# Patient Record
Sex: Male | Born: 1985 | Race: Black or African American | Hispanic: No | Marital: Single | Smoking: Current every day smoker
Health system: Southern US, Community
[De-identification: ages and names within clinical notes are randomized; demographics above are authoritative.]

---
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
## 2008-06-08 ENCOUNTER — Emergency Department (HOSPITAL_COMMUNITY): Admission: EM | Admit: 2008-06-08 | Discharge: 2008-06-08 | Payer: Self-pay | Admitting: Emergency Medicine

## 2008-12-21 ENCOUNTER — Emergency Department (HOSPITAL_COMMUNITY): Admission: EM | Admit: 2008-12-21 | Discharge: 2008-12-22 | Payer: Self-pay | Admitting: Emergency Medicine

## 2009-04-12 ENCOUNTER — Emergency Department (HOSPITAL_COMMUNITY): Admission: EM | Admit: 2009-04-12 | Discharge: 2009-04-12 | Payer: Self-pay | Admitting: Emergency Medicine

## 2009-04-12 ENCOUNTER — Emergency Department (HOSPITAL_COMMUNITY): Admission: EM | Admit: 2009-04-12 | Discharge: 2009-04-12 | Payer: Self-pay | Admitting: Family Medicine

## 2010-06-04 IMAGING — CT CT MAXILLOFACIAL W/O CM
3 of 4 series · 16 of 47 positions shown, 19 images · non-contrast
Comparison: Mandible radiographs done the same date.

CLINICAL DATA: Fall today.  Left mandible fracture.

CT MAXILLOFACIAL WITHOUT CONTRAST
TECHNIQUE: Multidetector CT imaging of the maxillofacial
structures was performed. Multiplanar CT image reconstructions were
also generated.

[Series 4: orbit/facial 2.0 h30s st · axial · 0.37mm/px · z∈[+896,+1022]mm · 10 of 75 slices shown, 13 images]
[im 6/75  brain]
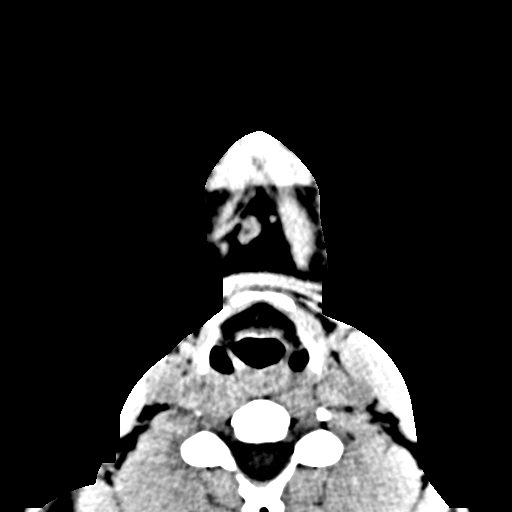
[im 6/75  bone]
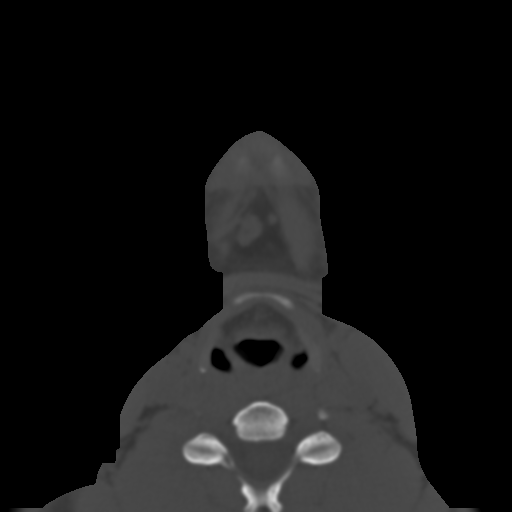
[im 14/75  bone]
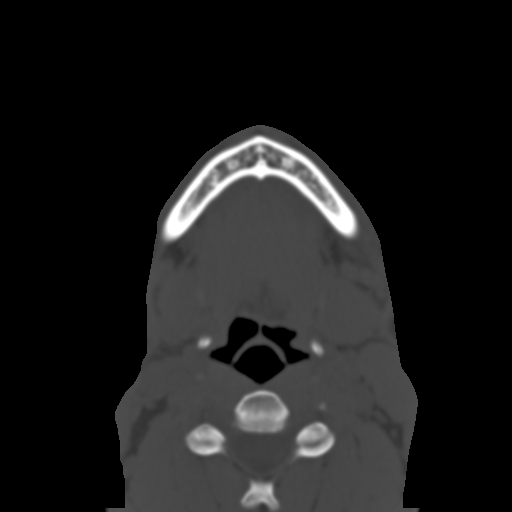
[im 19/75  bone]
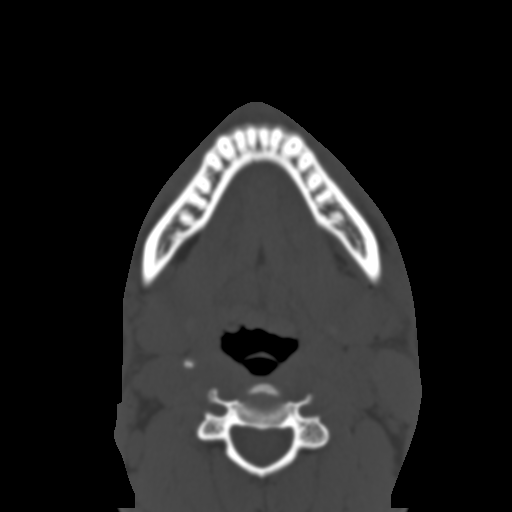
[im 27/75  bone]
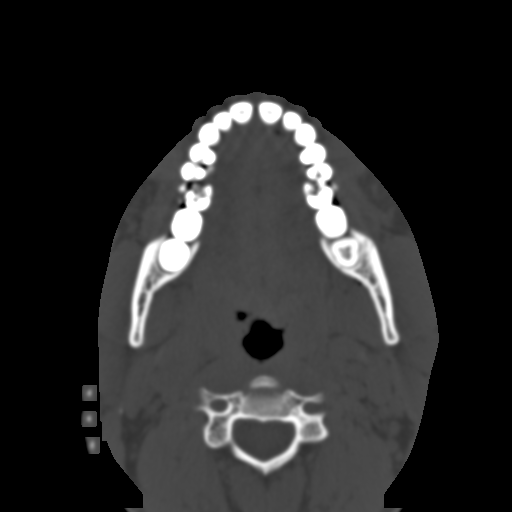
[im 35/75  brain]
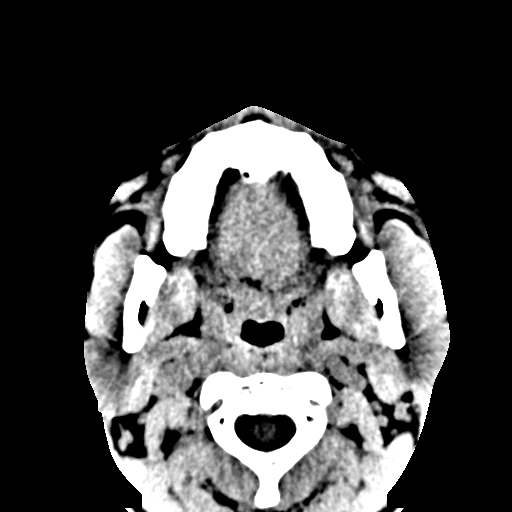
[im 35/75  bone]
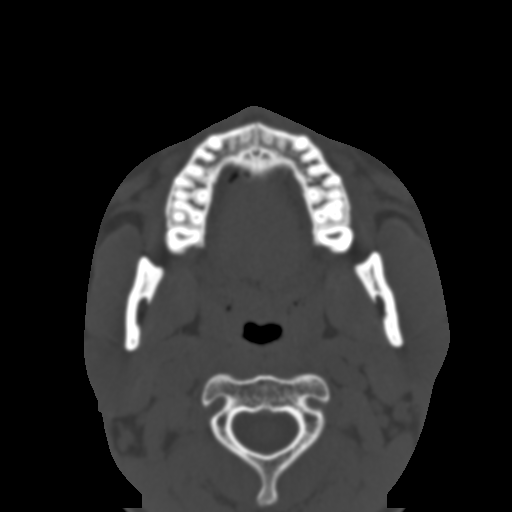
[im 40/75  bone]
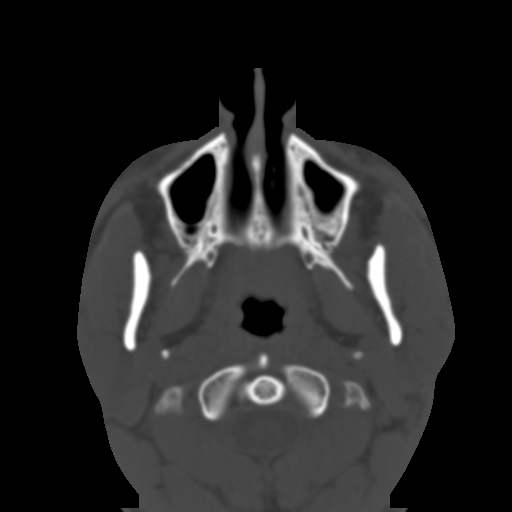
[im 48/75  bone]
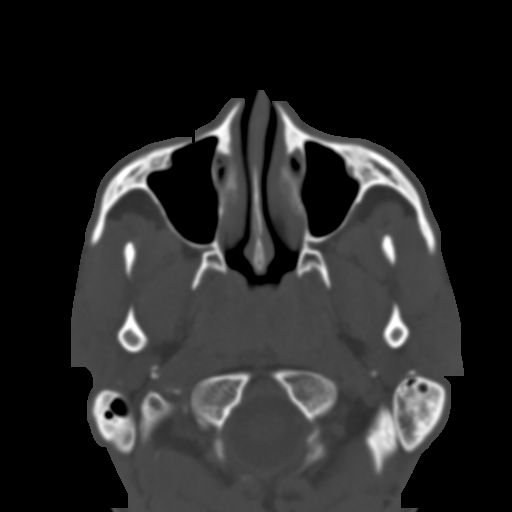
[im 56/75  bone]
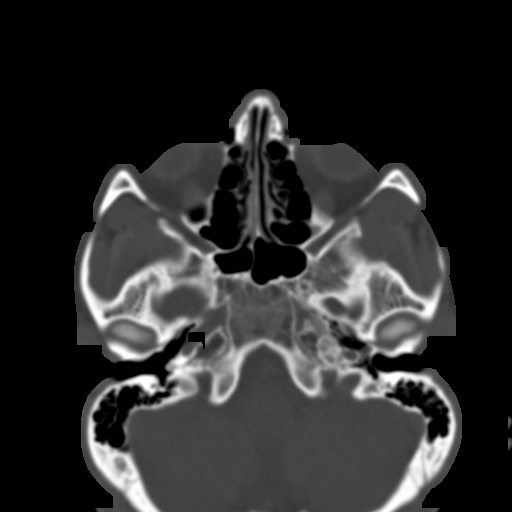
[im 61/75  brain]
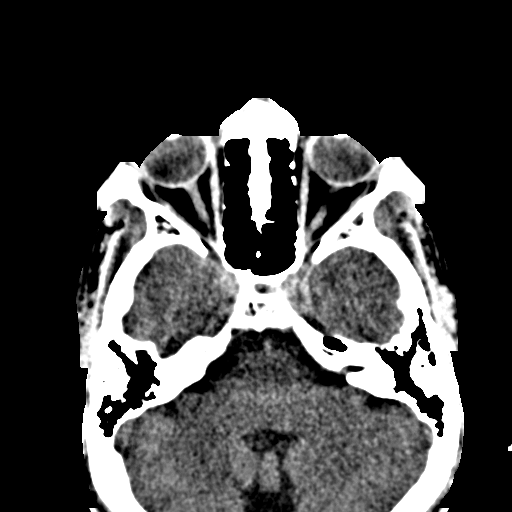
[im 61/75  bone]
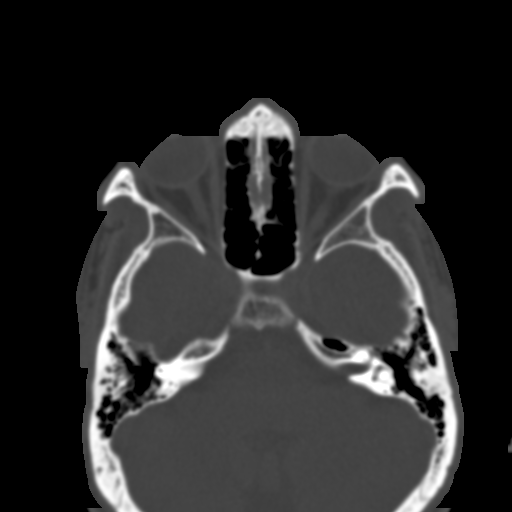
[im 69/75  bone]
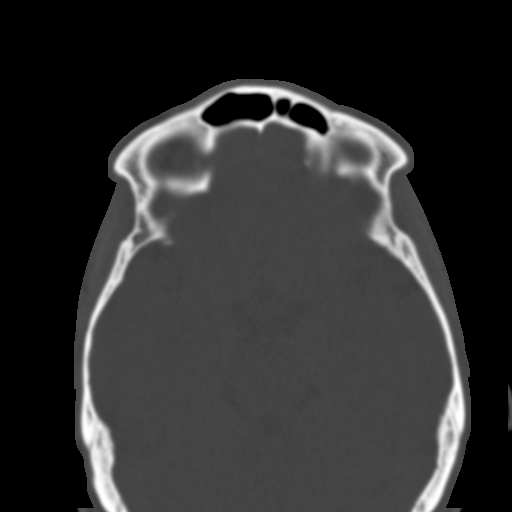

[Series 604: st cor · coronal · 0.37mm/px · 3 of 51 slices shown]
[im 17/51  bone]
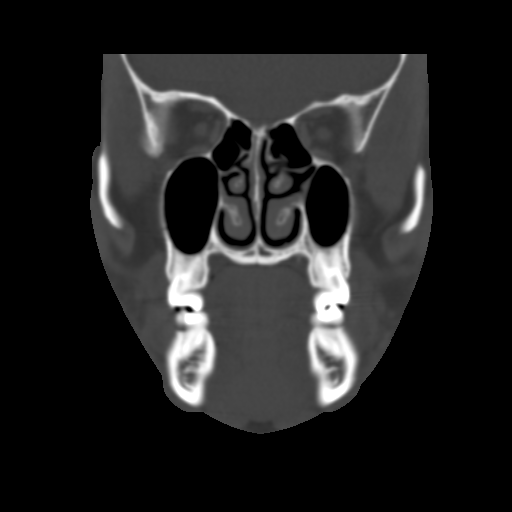
[im 23/51  bone]
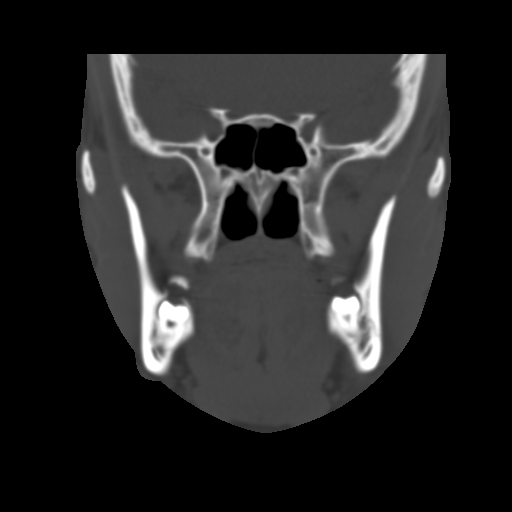
[im 28/51  bone]
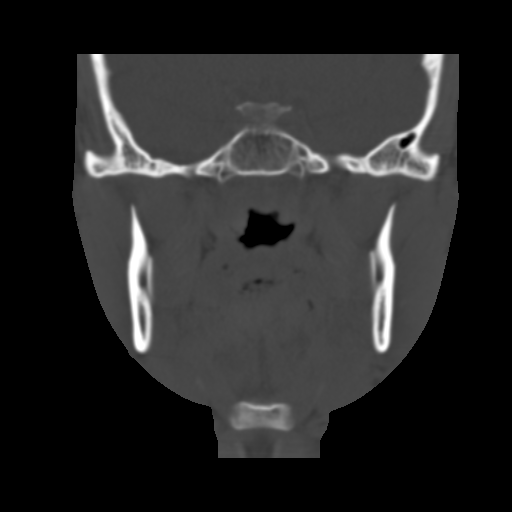

[Series 605: st sag · sagittal · 0.37mm/px · 3 of 53 slices shown]
[im 18/53  bone]
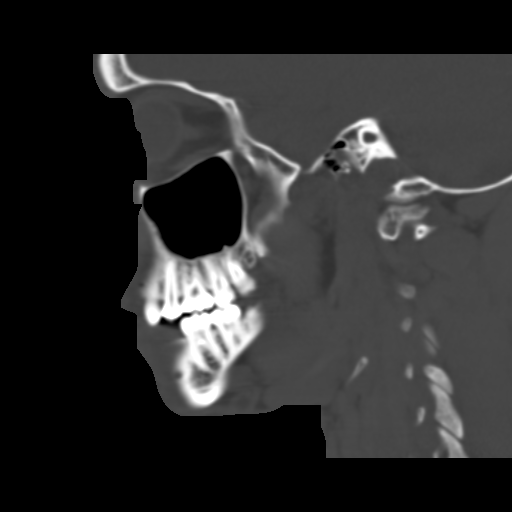
[im 27/53  bone]
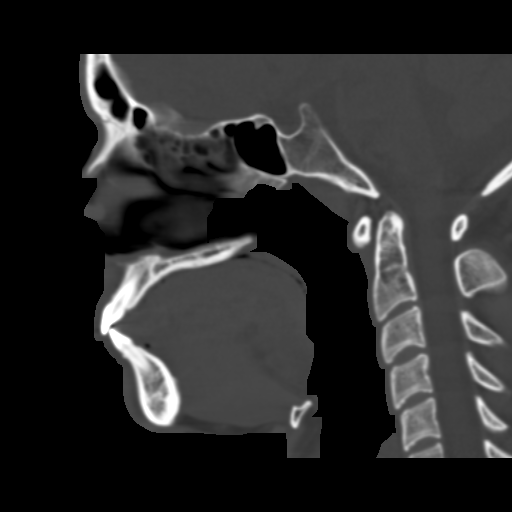
[im 35/53  bone]
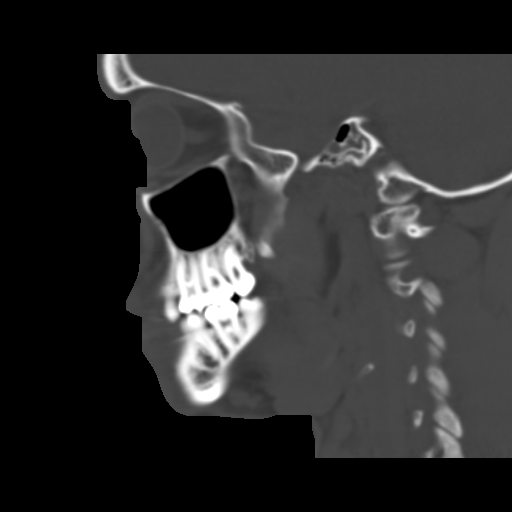

[16 of 47 positions shown; findings below may reference images not displayed]

FINDINGS: CT confirms the presence of a nondisplaced fracture of
the left mandibular body posteriorly.  This extends to the roots of
the left mandibular wisdom tooth.  No definite contralateral or
parasymphyseal fracture is identified.  The condylar necks are
intact.  The temporal mandibular joints are intact.  There is no
air in the TMJs.

No other facial fractures are identified.  The paranasal sinuses
are clear without air fluid levels.  There is no orbital hematoma.
Visualized intracranial contents appear unremarkable.
IMPRESSION: 1.  Nondisplaced fracture of the left mandibular body.
2.  No contralateral fracture, TMJ injury or other facial fracture
identified.

## 2010-09-13 ENCOUNTER — Encounter: Payer: Self-pay | Admitting: Family Medicine
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
## 2018-09-27 ENCOUNTER — Encounter (HOSPITAL_COMMUNITY): Payer: Self-pay | Admitting: Emergency Medicine

## 2018-09-27 ENCOUNTER — Emergency Department (HOSPITAL_COMMUNITY)
Admission: EM | Admit: 2018-09-27 | Discharge: 2018-09-27 | Disposition: A | Discharge status: Home / Self Care | Payer: Self-pay | Attending: Emergency Medicine | Admitting: Emergency Medicine

## 2018-09-27 ENCOUNTER — Other Ambulatory Visit: Payer: Self-pay

## 2018-09-27 DIAGNOSIS — R197 Diarrhea, unspecified: Secondary | ICD-10-CM | POA: Insufficient documentation

## 2018-09-27 DIAGNOSIS — F17210 Nicotine dependence, cigarettes, uncomplicated: Secondary | ICD-10-CM | POA: Insufficient documentation

## 2018-09-27 MED ORDER — LOPERAMIDE HCL 2 MG PO CAPS: 2.0000 mg | ORAL_CAPSULE | Freq: Four times a day (QID) | ORAL | 0 refills | Status: AC | PRN

## 2018-09-27 NOTE — ED Triage Notes (Signed)
Onset last night diarrhea, nausea, mild abdominal pain.

## 2018-09-27 NOTE — ED Provider Notes (Signed)
[facility] EMERGENCY DEPARTMENT Provider Note   CSN: [id number] Arrival date & time: 09/27/18  1742     History   Chief Complaint Chief Complaint  Patient presents with  . Diarrhea    HPI Gregg Roberson is a 33 y.o. male.  He is presenting with 4 episodes of loose stool since last evening.  He said he had a little bit upper cramping pain this morning that has resolved.  He had some nausea that was resolved.  No fevers no chills.  No urinary symptoms.  There was no blood in his stool.  No sick contacts or recent travel.  He is tried nothing for it.  The history is provided by the patient.  Diarrhea  Quality:  Watery Severity:  Moderate Onset quality:  Sudden Number of episodes:  4 Duration:  1 day Timing:  Intermittent Progression:  Partially resolved Relieved by:  Nothing Worsened by:  Nothing Ineffective treatments:  None tried Associated symptoms: abdominal pain   Associated symptoms: no chills, no recent cough, no fever, no headaches and no myalgias   Risk factors: no recent antibiotic use, no sick contacts, no suspicious food intake and no travel to endemic areas     History reviewed. No pertinent past medical history.  There are no active problems to display for this patient.   History reviewed. No pertinent surgical history.      Home Medications    Prior to Admission medications   Not on File    Family History No family history on file.  Social History Social History   Tobacco Use  . Smoking status: Current Every Day Smoker    Packs/day: 0.50    Types: Cigarettes  . Smokeless tobacco: Never Used  Substance Use Topics  . Alcohol use: Yes    Comment: socially  . Drug use: Never     Allergies   Patient has no allergy information on record.   Review of Systems Review of Systems  Constitutional: Negative for chills and fever.  HENT: Negative for sore throat.   Eyes: Negative for visual disturbance.  Respiratory: Negative for shortness of  breath.   Cardiovascular: Negative for chest pain.  Gastrointestinal: Positive for abdominal pain and diarrhea.  Genitourinary: Negative for dysuria.  Musculoskeletal: Negative for myalgias.  Skin: Negative for rash.  Neurological: Negative for headaches.     Physical Exam Updated Vital Signs BP (!) 141/90 (BP Location: Right Arm)   Pulse 80   Temp 98.2 F (36.8 C) (Oral)   Resp 20   SpO2 99%   Physical Exam Vitals signs and nursing note reviewed.  Constitutional:      Appearance: He is well-developed.  HENT:     Head: Normocephalic and atraumatic.  Eyes:     Conjunctiva/sclera: Conjunctivae normal.  Neck:     Musculoskeletal: Neck supple.  Cardiovascular:     Rate and Rhythm: Normal rate and regular rhythm.     Heart sounds: No murmur.  Pulmonary:     Effort: Pulmonary effort is normal. No respiratory distress.     Breath sounds: Normal breath sounds.  Abdominal:     Palpations: Abdomen is soft.     Tenderness: There is no abdominal tenderness. There is no guarding or rebound.  Musculoskeletal: Normal range of motion.        General: No tenderness.     Right lower leg: No edema.     Left lower leg: No edema.  Skin:    General: Skin is warm  and dry.     Capillary Refill: Capillary refill takes less than 2 seconds.  Neurological:     General: No focal deficit present.     Mental Status: He is alert and oriented to person, place, and time.      ED Treatments / Results  Labs (all labs ordered are listed, but only abnormal results are displayed) Labs Reviewed - No data to display  EKG None  Radiology No results found.  Procedures Procedures (including critical care time)  Medications Ordered in ED Medications - No data to display   Initial Impression / Assessment and Plan / ED Course  I have reviewed the triage vital signs and the nursing notes.  Pertinent labs & imaging results that were available during my care of the patient were reviewed by me  and considered in my medical decision making (see chart for details).  Clinical Course as of Sep 27 2317  Thu Sep 27, 2018  1812 I offered to do some blood work.  The patient said he did not think that was necessary and he just wanted to know if he should take something for the diarrhea.  I recommended Imodium and he is comfortable with going home without and returning if any worsening symptoms.   [MB]    Clinical Course User Index [MB] Terrilee Files, MD     Final Clinical Impressions(s) / ED Diagnoses   Final diagnoses:  Diarrhea, unspecified type    ED Discharge Orders         Ordered    loperamide (IMODIUM) 2 MG capsule  4 times daily PRN     09/27/18 1814           Terrilee Files, MD 09/27/18 2319

## 2018-09-27 NOTE — Discharge Instructions (Addendum)
You were seen in the emergency department for diarrhea.  Your vitals were normal as well as your physical exam.  We recommend that you stay well-hydrated and start on some diarrhea medication that we are prescribing.  Please return if any worsening symptoms.

## 2018-10-17 ENCOUNTER — Other Ambulatory Visit: Payer: Self-pay

## 2018-10-17 ENCOUNTER — Emergency Department (HOSPITAL_COMMUNITY)
Admission: EM | Admit: 2018-10-17 | Discharge: 2018-10-17 | Disposition: A | Discharge status: Home / Self Care | Payer: Self-pay | Attending: Emergency Medicine | Admitting: Emergency Medicine

## 2018-10-17 ENCOUNTER — Emergency Department (HOSPITAL_COMMUNITY): Payer: Self-pay

## 2018-10-17 ENCOUNTER — Encounter (HOSPITAL_COMMUNITY): Payer: Self-pay

## 2018-10-17 DIAGNOSIS — J40 Bronchitis, not specified as acute or chronic: Secondary | ICD-10-CM | POA: Insufficient documentation

## 2018-10-17 DIAGNOSIS — J189 Pneumonia, unspecified organism: Secondary | ICD-10-CM | POA: Insufficient documentation

## 2018-10-17 DIAGNOSIS — F17210 Nicotine dependence, cigarettes, uncomplicated: Secondary | ICD-10-CM | POA: Insufficient documentation

## 2018-10-17 MED ORDER — BENZONATATE 100 MG PO CAPS: 100.0000 mg | ORAL_CAPSULE | Freq: Three times a day (TID) | ORAL | 0 refills | Status: AC | PRN

## 2018-10-17 MED ORDER — AZITHROMYCIN 250 MG PO TABS
500.0000 mg | ORAL_TABLET | Freq: Once | ORAL | Status: AC
  Administered 2018-10-17: 500 mg via ORAL
  Filled 2018-10-17: qty 2

## 2018-10-17 MED ORDER — BENZONATATE 100 MG PO CAPS
100.0000 mg | ORAL_CAPSULE | Freq: Once | ORAL | Status: AC
  Administered 2018-10-17: 100 mg via ORAL
  Filled 2018-10-17: qty 1

## 2018-10-17 MED ORDER — AZITHROMYCIN 250 MG PO TABS: 250.0000 mg | ORAL_TABLET | Freq: Every day | ORAL | 0 refills | Status: AC

## 2018-10-17 NOTE — ED Triage Notes (Signed)
Pt presents to ED with complaints of fever, body aches, congestion which started last Monday. Pt also complaining of productive cough.

## 2018-10-17 NOTE — ED Provider Notes (Addendum)
Emergency Department Provider Note   I have reviewed the triage vital signs and the nursing notes.   HISTORY  Chief Complaint Influenza   HPI Gregg Roberson is a 33 y.o. male without significant past medical history the presents the emergency department today with 10 days worth of cough, fever, productive cough and some body aches.  Patient states that it started with a cough and upper respiratory type infection in his sinuses last Monday and has now become in his chest.  He started coughing up Sisk stuff along with the light green stuff is been progressively worsening.  He has had episodes of waking up at night feeling really hot and feeling really cold.  Sweaty as well.  No sick contacts. No other associated or modifying symptoms.    History reviewed. No pertinent past medical history.  There are no active problems to display for this patient.   History reviewed. No pertinent surgical history.  Current Outpatient Rx  . Order #: [id number] Class: Print  . Order #: [id number] Class: Print  . Order #: [id number] Class: Print    Allergies Patient has no allergy information on record.  No family history on file.  Social History Social History   Tobacco Use  . Smoking status: Current Every Day Smoker    Packs/day: 0.50    Types: Cigarettes  . Smokeless tobacco: Never Used  Substance Use Topics  . Alcohol use: Yes    Comment: socially  . Drug use: Never    Review of Systems  All other systems negative except as documented in the HPI. All pertinent positives and negatives as reviewed in the HPI. ____________________________________________   PHYSICAL EXAM:  VITAL SIGNS: Vitals:   10/17/18 1142 10/17/18 1439  BP:  138/72  Pulse:  75  Resp:  15  Temp:  98.1 F (36.7 C)  SpO2:  98%  Weight: 77.1 kg   Height: 5\' 6"  (1.676 m)     Constitutional: Alert and oriented. Well appearing and in no acute distress. Eyes: Conjunctivae are normal. PERRL. EOMI. Head:  Atraumatic. Nose: No congestion/rhinnorhea. Mouth/Throat: Mucous membranes are moist.  Oropharynx non-erythematous. Neck: No stridor.  No meningeal signs.   Cardiovascular: Normal rate, regular rhythm. Good peripheral circulation. Grossly normal heart sounds.   Respiratory: Normal respiratory effort.  No retractions. Lungs CTAB. Gastrointestinal: Soft and nontender. No distention.  Musculoskeletal: No lower extremity tenderness nor edema. No gross deformities of extremities. Neurologic:  Normal speech and language. No gross focal neurologic deficits are appreciated.  Skin:  Skin is warm, dry and intact. No rash noted.  ____________________________________________   RADIOLOGY  Dg Chest 2 View  Result Date: 10/17/2018 CLINICAL DATA:  Productive cough, chest pain. EXAM: CHEST - 2 VIEW COMPARISON:  Radiographs of Dec 21, 2008. FINDINGS: The heart size and mediastinal contours are within normal limits. Both lungs are clear. No pneumothorax or pleural effusion is noted. The visualized skeletal structures are unremarkable. IMPRESSION: No active cardiopulmonary disease. Electronically Signed   By: Lupita Raider, M.D.   On: 10/17/2018 14:19    ____________________________________________   INITIAL IMPRESSION / ASSESSMENT AND PLAN / ED COURSE  Patient with progressively worsening respiratory illness concerning for committee acquired pneumonia.  Spent over a week the symptoms have been going on so we will start on antibiotics and Tessalon Perles.  Even if he had influenza he is outside the window and not at high risk for complications so will not treat or check.  Low suspicion for pulmonary embolus.  Pertinent  labs & imaging results that were available during my care of the patient were reviewed by me and considered in my medical decision making (see chart for details).  ____________________________________________  FINAL CLINICAL IMPRESSION(S) / ED DIAGNOSES  Final diagnoses:  Community  acquired pneumonia, unspecified laterality  Bronchitis     MEDICATIONS GIVEN DURING THIS VISIT:  Medications  benzonatate (TESSALON) capsule 100 mg (100 mg Oral Given 10/17/18 1433)  azithromycin (ZITHROMAX) tablet 500 mg (500 mg Oral Given 10/17/18 1433)     NEW OUTPATIENT MEDICATIONS STARTED DURING THIS VISIT:  Discharge Medication List as of 10/17/2018  2:19 PM    START taking these medications   Details  azithromycin (ZITHROMAX) 250 MG tablet Take 1 tablet (250 mg total) by mouth daily. Take 1 every day until finished., Starting Wed 10/17/2018, Print    benzonatate (TESSALON) 100 MG capsule Take 1 capsule (100 mg total) by mouth 3 (three) times daily as needed for cough., Starting Wed 10/17/2018, Print        Note:  This note was prepared with assistance of Dragon voice recognition software. Occasional wrong-word or sound-a-like substitutions may have occurred due to the inherent limitations of voice recognition software.   Emmanuelle Coxe, Barbara Cower, MD 10/17/18 1439    Bama Hanselman, Barbara Cower, MD 10/17/18 [id number]

## 2019-12-09 IMAGING — DX DG CHEST 2V
2 series · 2 of 2 positions shown · non-contrast
Comparison: Radiographs December 21, 2008.

CLINICAL DATA: Productive cough, chest pain.

EXAM:
CHEST - 2 VIEW

[chest pa]
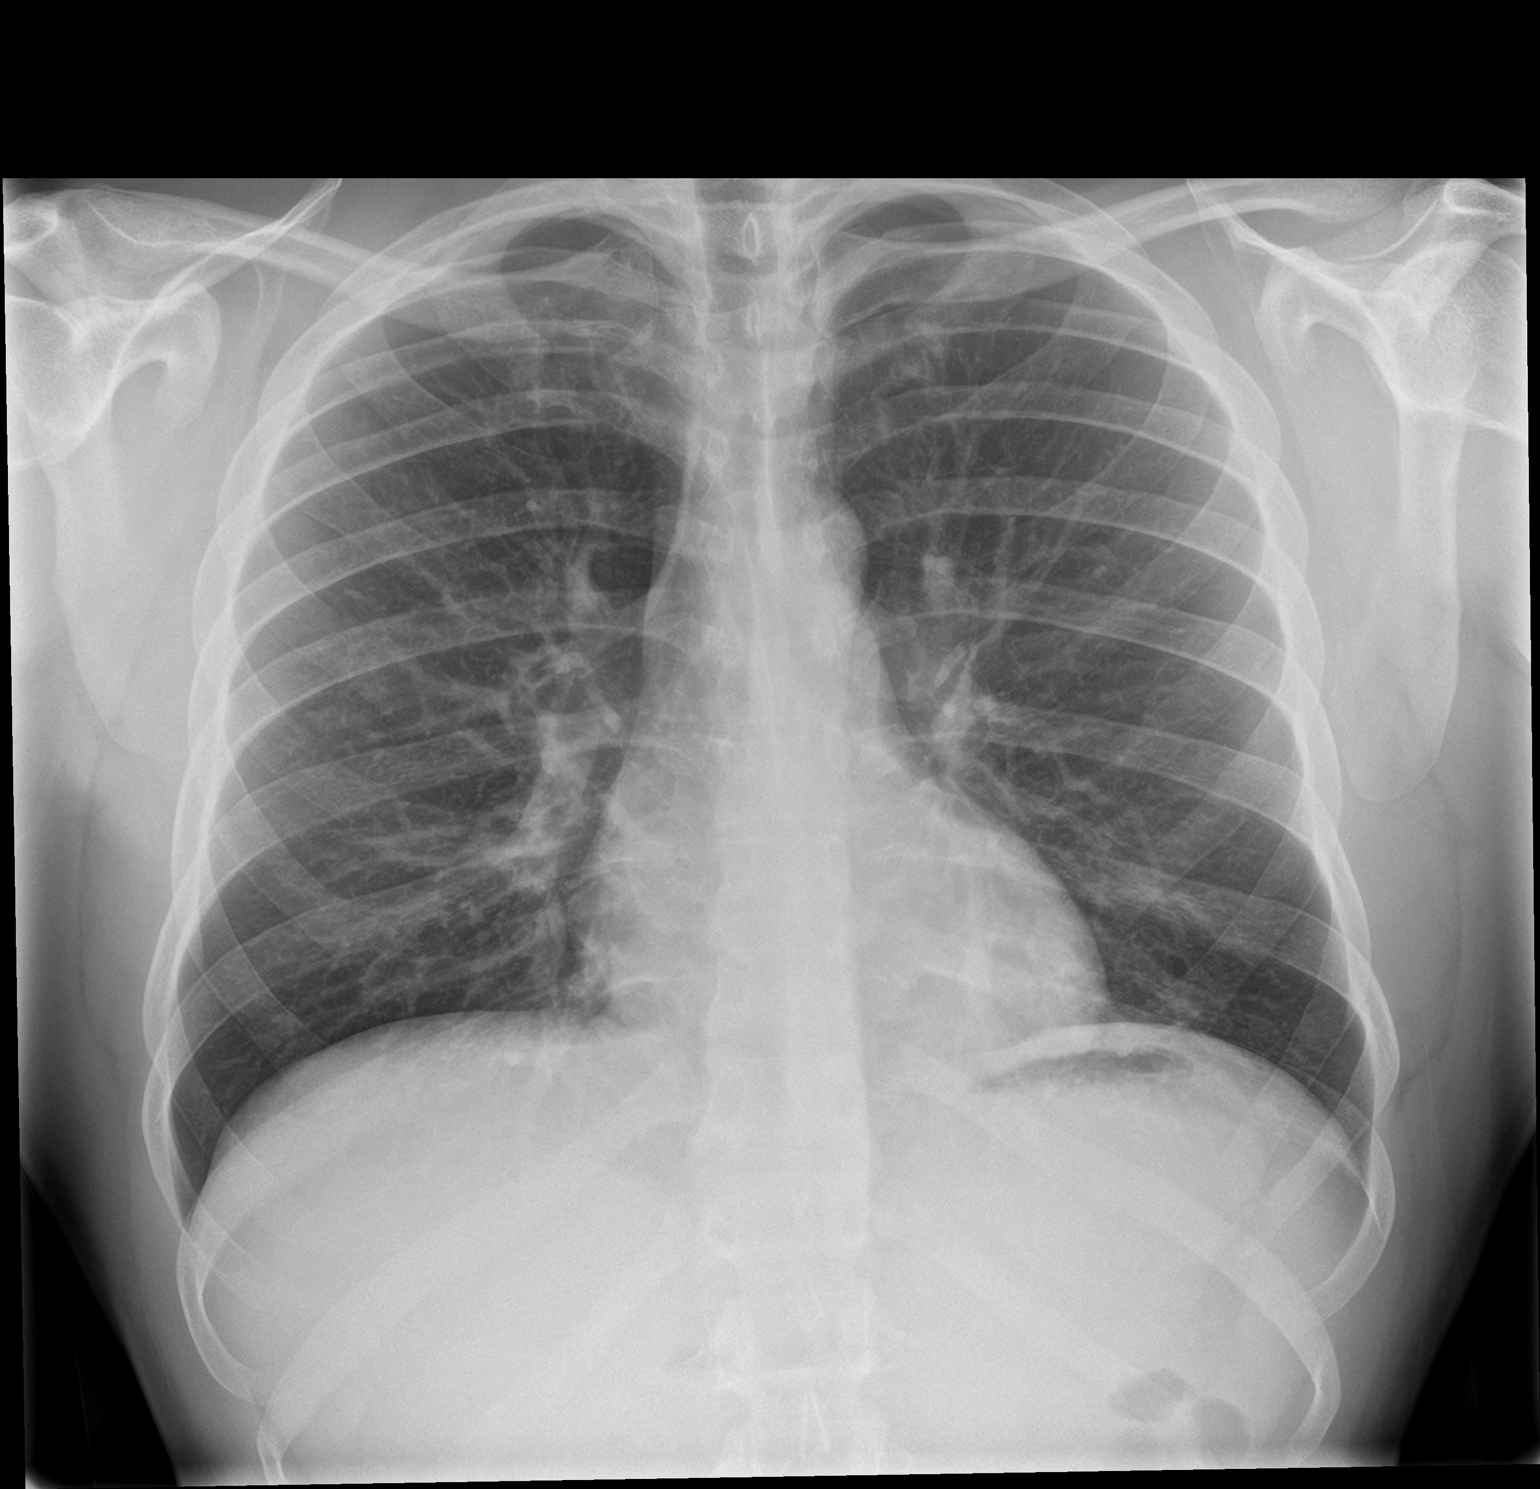

[chest lat]
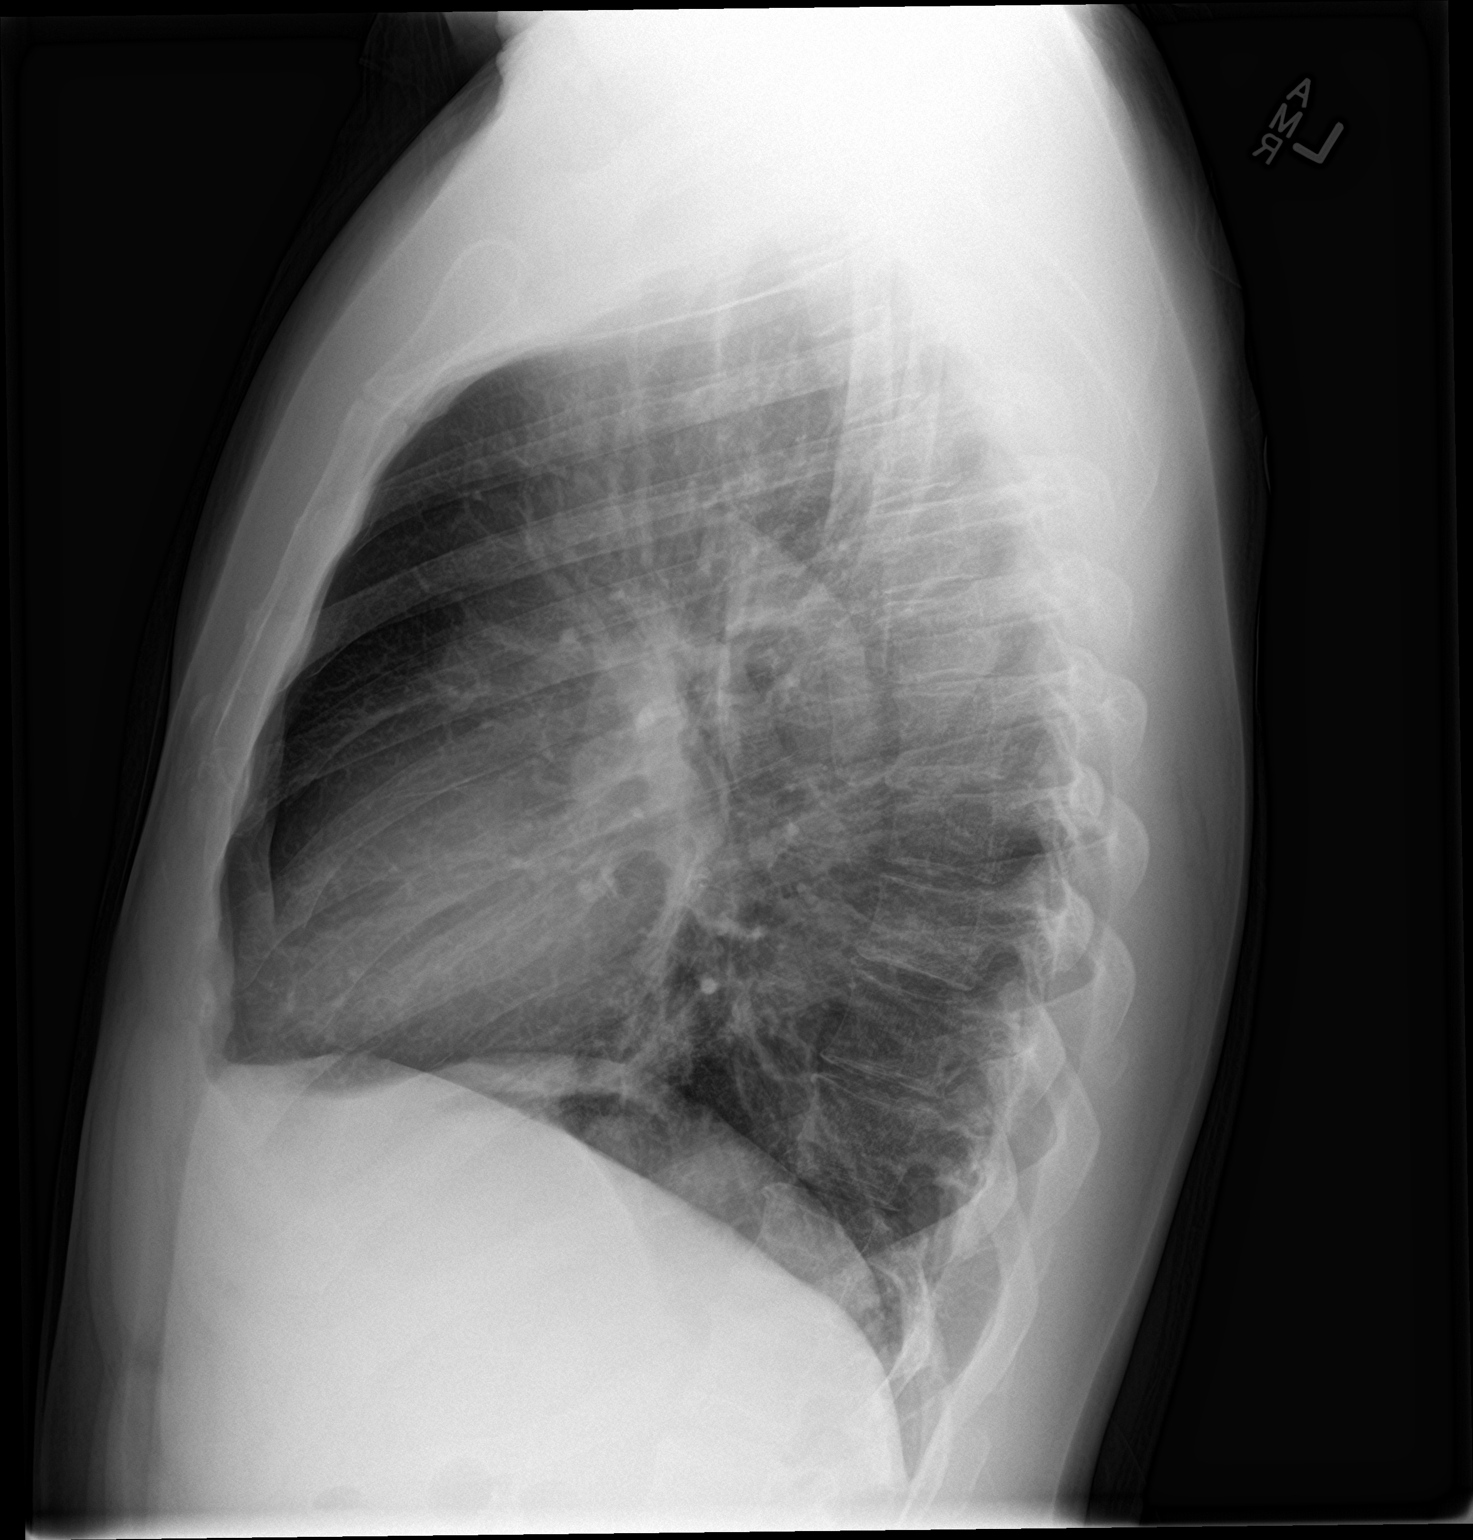

[2 of 2 positions shown; findings below may reference images not displayed]

FINDINGS: The heart size and mediastinal contours are within normal limits.
Both lungs are clear. No pneumothorax or pleural effusion is noted.
The visualized skeletal structures are unremarkable.
IMPRESSION: No active cardiopulmonary disease.
